# Patient Record
Sex: Male | Born: 1990 | Race: White | Hispanic: No | Marital: Single | Smoking: Never smoker
Health system: Southern US, Community
[De-identification: ages and names within clinical notes are randomized; demographics above are authoritative.]

---
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
## 2015-11-12 ENCOUNTER — Emergency Department (HOSPITAL_COMMUNITY): Payer: Worker's Compensation

## 2015-11-12 ENCOUNTER — Encounter (HOSPITAL_COMMUNITY): Payer: Self-pay | Admitting: Emergency Medicine

## 2015-11-12 ENCOUNTER — Emergency Department (HOSPITAL_COMMUNITY)
Admission: EM | Admit: 2015-11-12 | Discharge: 2015-11-12 | Disposition: A | Discharge status: Home / Self Care | Payer: Worker's Compensation | Attending: Emergency Medicine | Admitting: Emergency Medicine

## 2015-11-12 DIAGNOSIS — Y939 Activity, unspecified: Secondary | ICD-10-CM | POA: Diagnosis not present

## 2015-11-12 DIAGNOSIS — S99922A Unspecified injury of left foot, initial encounter: Secondary | ICD-10-CM | POA: Diagnosis present

## 2015-11-12 DIAGNOSIS — Y929 Unspecified place or not applicable: Secondary | ICD-10-CM | POA: Diagnosis not present

## 2015-11-12 DIAGNOSIS — S92912A Unspecified fracture of left toe(s), initial encounter for closed fracture: Secondary | ICD-10-CM

## 2015-11-12 DIAGNOSIS — S92425A Nondisplaced fracture of distal phalanx of left great toe, initial encounter for closed fracture: Secondary | ICD-10-CM | POA: Diagnosis not present

## 2015-11-12 DIAGNOSIS — Y990 Civilian activity done for income or pay: Secondary | ICD-10-CM | POA: Insufficient documentation

## 2015-11-12 DIAGNOSIS — S9032XA Contusion of left foot, initial encounter: Secondary | ICD-10-CM

## 2015-11-12 DIAGNOSIS — W2203XA Walked into furniture, initial encounter: Secondary | ICD-10-CM | POA: Diagnosis not present

## 2015-11-12 DIAGNOSIS — S91209A Unspecified open wound of unspecified toe(s) with damage to nail, initial encounter: Secondary | ICD-10-CM

## 2015-11-12 MED ORDER — OXYCODONE HCL 5 MG PO TABS: 5.0000 mg | ORAL_TABLET | ORAL | 0 refills | Status: DC | PRN

## 2015-11-12 NOTE — ED Provider Notes (Signed)
[facility] DEPT Provider Note   CSN: [id number] Arrival date & time: 11/12/15  1744  First Provider Contact:  First MD Initiated Contact with Patient 11/12/15 1807     By signing my name below, I, Jerry Hinton, attest that this documentation has been prepared under the direction and in the presence of [employer]iliated Computer ServicesHobson Adrianah Prophete PA-C.  Electronically Signed: Vista Minkobert Hinton, ED Scribe. 11/12/15. 6:48 PM.   History   Chief Complaint Chief Complaint  Patient presents with  . Foot Injury    HPI HPI Comments: Jerry Hinton is a 25 y.o. male who presents to the Emergency Department complaining of left foot pain s/p an injury that occurred around 1400 today while he was at work. Pt states that his left foot was ran over by a 2500lb pallet. Pt states that he continued to work for the rest of the day. Pt is able to ambulate. Pt's toenail to the second toe is removed. Pt denies taking blood thinners. Denies Hx of bleeding disorders or procedures involving the foot.    The history is provided by the patient. No language interpreter was used.    History reviewed. No pertinent past medical history.  There are no active problems to display for this patient.   History reviewed. No pertinent surgical history.   Home Medications    Prior to Admission medications   Not on File    Family History No family history on file.  Social History Social History  Substance Use Topics  . Smoking status: Never Smoker  . Smokeless tobacco: Never Used  . Alcohol use No     Allergies   Tylenol [acetaminophen]   Review of Systems Review of Systems  Musculoskeletal: Positive for arthralgias (left foot).  Hematological: Does not bruise/bleed easily.  All other systems reviewed and are negative.    Physical Exam Updated Vital Signs BP 145/85 (BP Location: Left Arm)   Pulse 82   Temp 98.8 F (37.1 C) (Oral)   Resp 16   Ht 5\' 7"  (1.702 m)   Wt 158 lb (71.7 kg)   SpO2 100%   BMI 24.75 kg/m    Physical Exam  Constitutional: He is oriented to person, place, and time. He appears well-developed and well-nourished. No distress.  HENT:  Head: Normocephalic and atraumatic.  Neck: Normal range of motion.  Cardiovascular: Normal rate, regular rhythm and intact distal pulses.   Pulmonary/Chest: Effort normal.  Musculoskeletal:  Full ROM of the left hip and knee. 2+ Dorsalis pedis. 2+ Posterior tibial. Cap refill <2 seconds.  Neurological: He is alert and oriented to person, place, and time.  Skin: Skin is warm and dry. He is not diaphoretic.  Bruising to the dorsum and plantar surface of left first toe; distal portion. No subungual hematoma. Traumatic absense of the nail of the second toe. Bruising of the dorsum and plantar surface of the second toe. No bruising of the third, fourth of fifth toe. No lesions or lacerations in between the toes. No bruising of the heel. Achilles tendon intact. Decreased sensation to touch at the distal first toe  Psychiatric: He has a normal mood and affect. Judgment normal.  Nursing note and vitals reviewed.   ED Treatments / Results  DIAGNOSTIC STUDIES: Oxygen Saturation is 100% on RA, normal by my interpretation.  COORDINATION OF CARE: 6:42 PM-Will wait for imaging results. Discussed treatment plan with pt at bedside and pt agreed to plan.   Labs (all labs ordered are listed, but only abnormal results are  displayed) Labs Reviewed - No data to display  EKG  EKG Interpretation None       Radiology No results found.  Procedures Procedures (including critical care time)  Medications Ordered in ED Medications - No data to display   Initial Impression / Assessment and Plan / ED Course  I have reviewed the triage vital signs and the nursing notes.  Pertinent labs & imaging results that were available during my care of the patient were reviewed by me and considered in my medical decision making (see chart for details).  Clinical Course   FRACTURE CARE LEFT FOOT. Patient reports having a heavy pallet follows left foot. He sustained fractures to the left great toe. An avulsion of the nail of the second toe of the left foot.  I described the buddy tape of the procedure as well as bandage to the second toe to the patient in terms which he understands and he gives permission to proceed with this procedure.  The patient was identified by arm band. The wound to the left second toe was cleansed with Betadine. A Telfa nonstick dressing was applied to the denuded nail area. Following this cast webbing was used as a splint between the first and second toes. The 2 toes were buddy taped, and Ace bandage was applied to the left foot. The patient was then fitted with a postoperative shoe. Following the procedure, the patient had good capillary refill of the toes of the left foot, there no temperature changes appreciated.  Prescription for Roxicodone was given to the patient for pain. The patient tolerated the procedure without problem.  *I have reviewed nursing notes, vital signs, and all appropriate lab and imaging results for this patient.**  Final Clinical Impressions(s) / ED Diagnoses Discussed the need for ice and elevation to the foot (left). We discussed the need for with a peak evaluation in the office. The patient will use ibuprofen for mild pain, he will use Roxicodone for more severe pain. Paperwork from the job site was filled out and given back to the patient. Questions were answered, and the patient was discharged home.    Final diagnoses:  Toe fracture, left, closed, initial encounter  Nail avulsion of toe, initial encounter  Contusion, foot, left, initial encounter    New Prescriptions New Prescriptions   No medications on file  **I personally performed the services described in this documentation, which was scribed in my presence. The recorded information has been reviewed and is accurate.    Ivery Quale,  PA-C 11/13/15 [id number]    Donnetta Hutching, MD 11/15/15 [phone]

## 2015-11-12 NOTE — ED Triage Notes (Addendum)
Patient c/o left foot pain. Per patient injured today at work. Patient states that left foot was ran over by a 2500lb pallet. Per patient happened at 2pm but continued to work. Patient able to ambulate. Second toenail removed.

## 2015-11-12 NOTE — Discharge Instructions (Signed)
Your left big toe is broken in 2 places. You have an avulsion of the toenail of the left second toe. You have a contusion of the left foot. Please apply ice to your foot, and keep it elevated above your waist. Use ibuprofen every 6 hours for mild pain, use Roxicodone for more severe pain. Roxicodone may cause drowsiness, and/or constipation. Please use this medication with caution. Please see Dr. Pricilla Holmucker or a member of his team concerning the fracture of your great toe, and the injury 2 years second toe.

## 2017-07-24 IMAGING — DX DG FOOT COMPLETE 3+V*L*
3 series · 3 of 3 positions shown · non-contrast
Comparison: None.

CLINICAL DATA: Injured at work. Crush injury. Injury happened
today.

EXAM:
LEFT FOOT - COMPLETE 3+ VIEW

[foot ap]
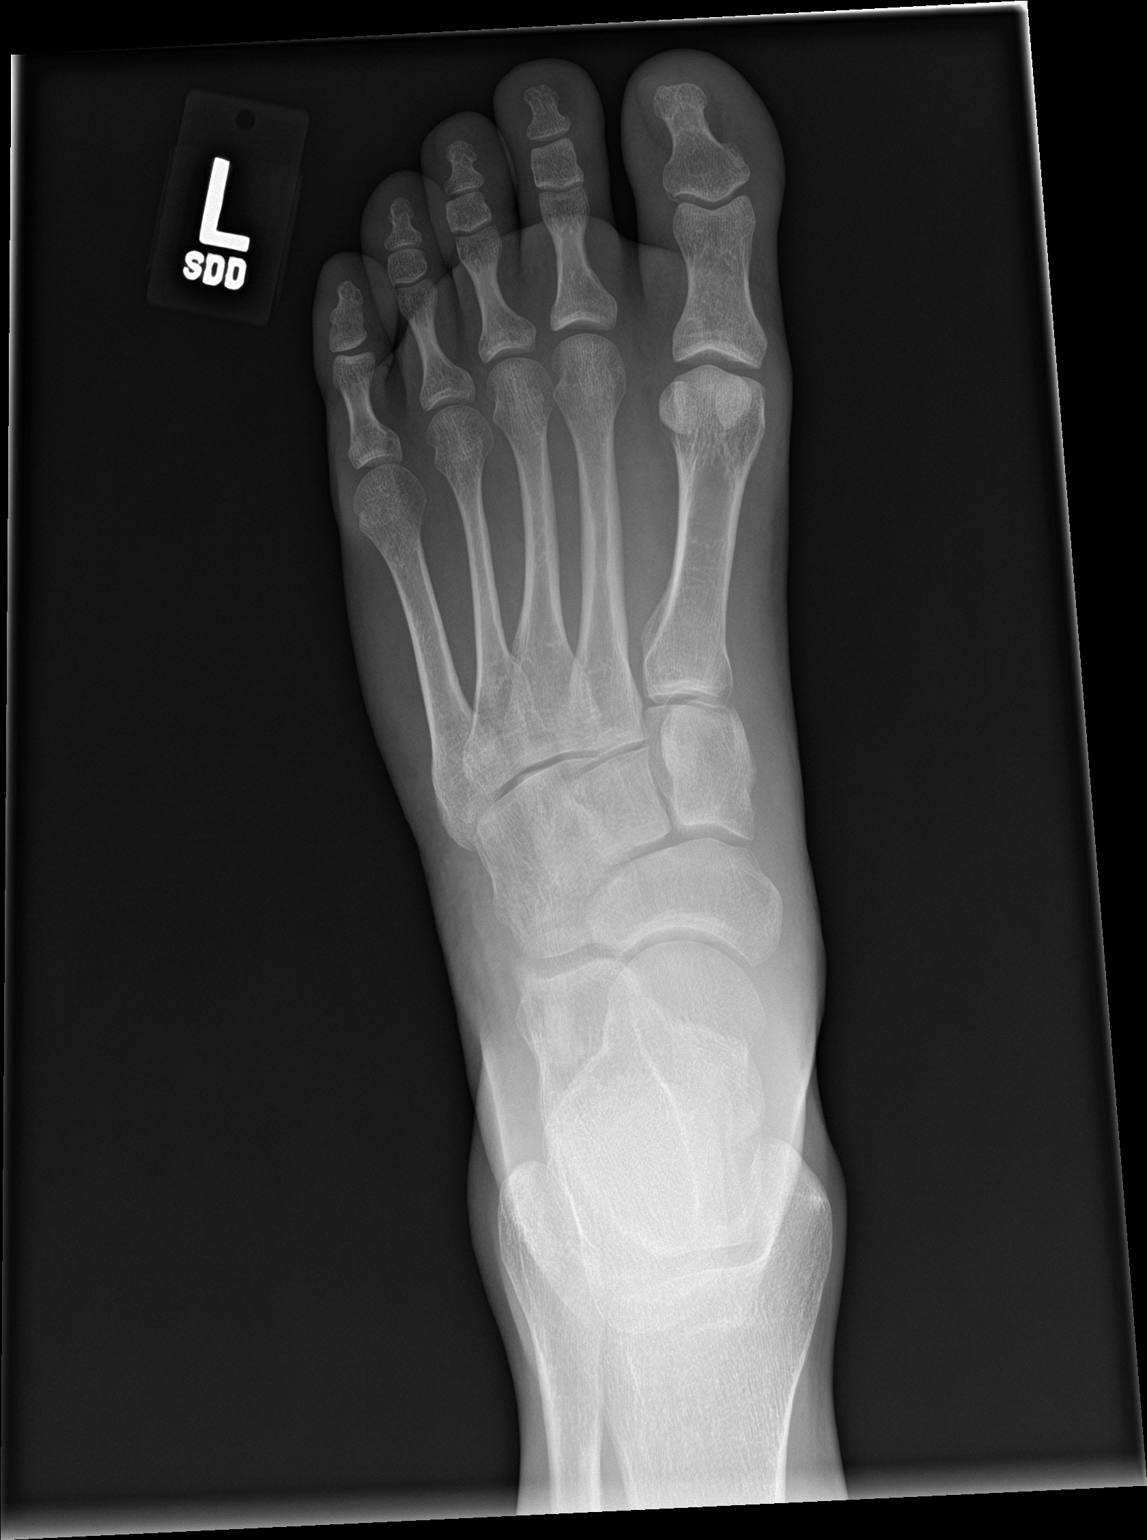

[foot obl]
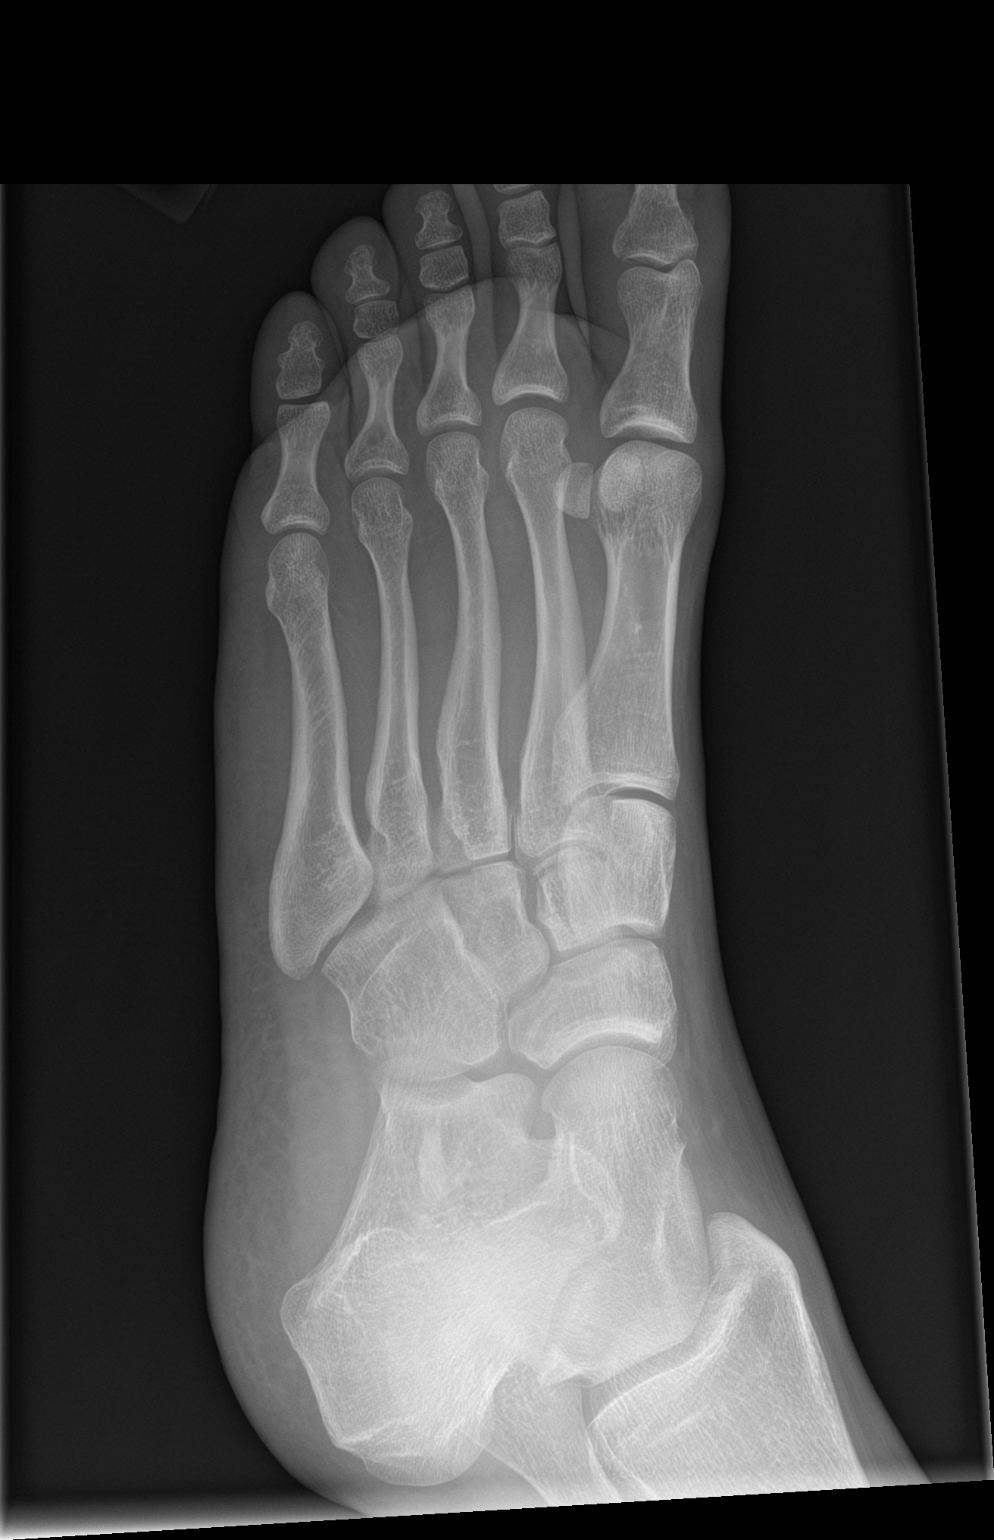

[foot lat]
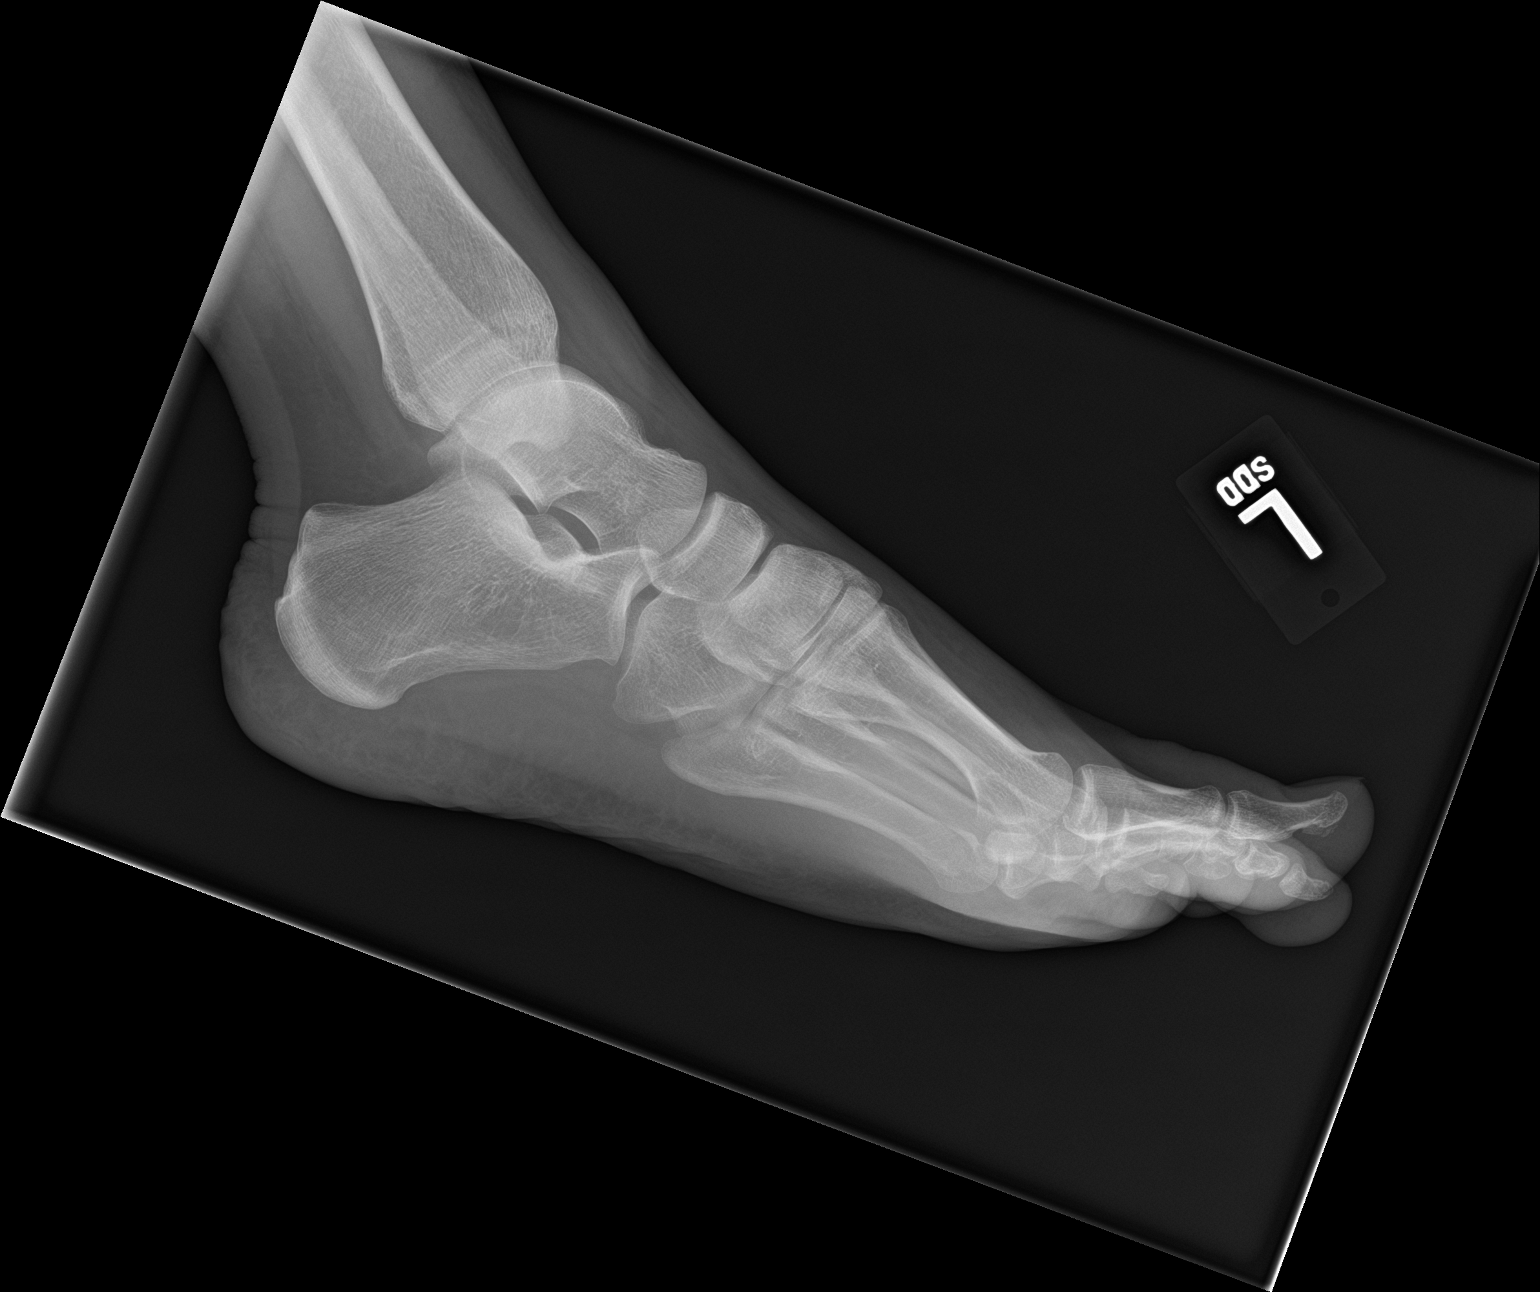

[3 of 3 positions shown; findings below may reference images not displayed]

FINDINGS: There are nondisplaced fractures of the distal phalanx of the great
toe at the proximal lateral corner her and at the plantar aspect of
the distal tuft. No other foot fracture identified.
IMPRESSION: Nondisplaced fractures of the distal phalanx at the proximal lateral
corner and at the plantar aspect of the distal tuft.

## 2018-11-13 ENCOUNTER — Other Ambulatory Visit: Payer: Self-pay

## 2018-11-13 ENCOUNTER — Ambulatory Visit
Admission: EM | Admit: 2018-11-13 | Discharge: 2018-11-13 | Disposition: A | Discharge status: Home / Self Care | Payer: BC Managed Care – PPO | Attending: Emergency Medicine | Admitting: Emergency Medicine

## 2018-11-13 DIAGNOSIS — L0889 Other specified local infections of the skin and subcutaneous tissue: Secondary | ICD-10-CM | POA: Diagnosis not present

## 2018-11-13 DIAGNOSIS — L03115 Cellulitis of right lower limb: Secondary | ICD-10-CM

## 2018-11-13 DIAGNOSIS — S80819A Abrasion, unspecified lower leg, initial encounter: Secondary | ICD-10-CM

## 2018-11-13 DIAGNOSIS — L03119 Cellulitis of unspecified part of limb: Secondary | ICD-10-CM

## 2018-11-13 DIAGNOSIS — L03114 Cellulitis of left upper limb: Secondary | ICD-10-CM | POA: Diagnosis not present

## 2018-11-13 DIAGNOSIS — L089 Local infection of the skin and subcutaneous tissue, unspecified: Secondary | ICD-10-CM

## 2018-11-13 MED ORDER — CEFTRIAXONE SODIUM 1 G IJ SOLR
1.0000 g | Freq: Once | INTRAMUSCULAR | Status: AC
  Administered 2018-11-13: 16:00:00 1 g via INTRAMUSCULAR

## 2018-11-13 MED ORDER — DOXYCYCLINE HYCLATE 100 MG PO CAPS: 100.0000 mg | ORAL_CAPSULE | Freq: Two times a day (BID) | ORAL | 0 refills | Status: AC

## 2018-11-13 NOTE — ED Provider Notes (Signed)
[facility]   [id number] 11/13/18 Arrival Time: 1520  CC:  Rash  SUBJECTIVE:  Jerry Hinton is a 28 y.o. male who presents with a rash that began 9 days ago.  Symptoms began after he came in to contact with poison ivy. Localizes the rash to bilateral lower extremities.  Describes it as red and mildly painful.  Was treated with a prednisone taper.  Denies improvement in symptoms.  States redness and pain has increased.  Symptoms are made worse with wearing work boots.  Reports similar symptoms in the past related to poison ivy, that did not last this long.  Denies fever, chills, nausea, vomiting, swelling, discharge, mouth swelling, tongue/ throat swelling, SOB, chest pain, abdominal pain, changes in bowel or bladder function.    ROS: As per HPI.  All other pertinent ROS negative.     History reviewed. No pertinent past medical history. History reviewed. No pertinent surgical history. Allergies  Allergen Reactions  . Tylenol [Acetaminophen] Other (See Comments)    Fever    No current facility-administered medications on file prior to encounter.    No current outpatient medications on file prior to encounter.   Social History   Socioeconomic History  . Marital status: Single    Spouse name: Not on file  . Number of children: Not on file  . Years of education: Not on file  . Highest education level: Not on file  Occupational History  . Not on file  Social Needs  . Financial resource strain: Not on file  . Food insecurity    Worry: Not on file    Inability: Not on file  . Transportation needs    Medical: Not on file    Non-medical: Not on file  Tobacco Use  . Smoking status: Never Smoker  . Smokeless tobacco: Never Used  Substance and Sexual Activity  . Alcohol use: No  . Drug use: No  . Sexual activity: Not on file  Lifestyle  . Physical activity    Days per week: Not on file    Minutes per session: Not on file  . Stress: Not on file  Relationships  .  Social Musicianconnections    Talks on phone: Not on file    Gets together: Not on file    Attends religious service: Not on file    Active member of club or organization: Not on file    Attends meetings of clubs or organizations: Not on file    Relationship status: Not on file  . Intimate partner violence    Fear of current or ex partner: Not on file    Emotionally abused: Not on file    Physically abused: Not on file    Forced sexual activity: Not on file  Other Topics Concern  . Not on file  Social History Narrative  . Not on file   Family History  Problem Relation Age of Onset  . Cancer Mother     OBJECTIVE: Vitals:   11/13/18 1532  BP: (!) 144/97  Pulse: (!) 109  Resp: 18  Temp: 98.8 F (37.1 C)  SpO2: 96%    General appearance: alert; no distress Head: NCAT Lungs: clear to auscultation bilaterally; normal respiratory effort Heart: regular rate and rhythm.  Radial pulse 2+ bilaterally Extremities: no edema Skin: warm and dry; erythematous macular rash localized to bilateral LEs in sparse distribution, blanches with pressure, superficial abrasion to bilateral medial ankles, no obvious drainage or bleeding, mildly TTP (see pictures below)  Psychological: alert and cooperative; normal mood and affect          ASSESSMENT & PLAN:  1. Cellulitis of lower extremity, unspecified laterality   2. Abrasion, leg w/ infection, unspecified laterality, initial encounter     Meds ordered this encounter  Medications  . doxycycline (VIBRAMYCIN) 100 MG capsule    Sig: Take 1 capsule (100 mg total) by mouth 2 (two) times daily.    Dispense:  20 capsule    Refill:  0    Order Specific Question:   Supervising Provider    Answer:   Raylene Everts [[id number]]  . cefTRIAXone (ROCEPHIN) injection 1 g   Rocephin shot given in office Prescribed doxycycline take as directed and to completion Continue to alternate ibuprofen and tylenol as needed for pain and fever Follow up with  PCP if symptoms persists Return or go to the ED if you have any new or worsening symptoms such as increased pain, redness, swelling, discharge, high fever, night sweats, abdominal pain, etc...  Reviewed expectations re: course of current medical issues. Questions answered. Outlined signs and symptoms indicating need for more acute intervention. Patient verbalized understanding. After Visit Summary given.   Lestine Box, PA-C 11/13/18 1717

## 2018-11-13 NOTE — ED Triage Notes (Signed)
Pt has completed prednisone for poison ivy, rash has worsened

## 2018-11-13 NOTE — Discharge Instructions (Signed)
Rocephin shot given in office Prescribed doxycycline take as directed and to completion Continue to alternate ibuprofen and tylenol as needed for pain and fever Follow up with PCP if symptoms persists Return or go to the ED if you have any new or worsening symptoms such as increased pain, redness, swelling, discharge, high fever, night sweats, abdominal pain, etc..Marland Kitchen
